# Patient Record
Sex: Male | Born: 1990 | Race: White | Hispanic: No | Marital: Single | Smoking: Current every day smoker
Health system: Southern US, Community
[De-identification: ages and names within clinical notes are randomized; demographics above are authoritative.]

## PROBLEM LIST (undated history)

## (undated) HISTORY — PX: HERNIA REPAIR: SHX51

---
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
## 2013-12-01 ENCOUNTER — Emergency Department: Payer: Self-pay | Admitting: Emergency Medicine

## 2013-12-23 ENCOUNTER — Emergency Department: Payer: Self-pay | Admitting: Emergency Medicine

## 2016-03-11 ENCOUNTER — Encounter: Payer: Self-pay | Admitting: Emergency Medicine

## 2016-03-11 ENCOUNTER — Emergency Department
Admission: EM | Admit: 2016-03-11 | Discharge: 2016-03-11 | Disposition: A | Discharge status: Home / Self Care | Payer: Self-pay | Attending: Emergency Medicine | Admitting: Emergency Medicine

## 2016-03-11 DIAGNOSIS — J40 Bronchitis, not specified as acute or chronic: Secondary | ICD-10-CM | POA: Insufficient documentation

## 2016-03-11 DIAGNOSIS — F17200 Nicotine dependence, unspecified, uncomplicated: Secondary | ICD-10-CM | POA: Insufficient documentation

## 2016-03-11 DIAGNOSIS — J069 Acute upper respiratory infection, unspecified: Secondary | ICD-10-CM | POA: Insufficient documentation

## 2016-03-11 MED ORDER — IPRATROPIUM-ALBUTEROL 0.5-2.5 (3) MG/3ML IN SOLN
RESPIRATORY_TRACT | Status: AC
  Administered 2016-03-11: 3 mL via RESPIRATORY_TRACT
  Filled 2016-03-11: qty 3

## 2016-03-11 MED ORDER — ALBUTEROL SULFATE HFA 108 (90 BASE) MCG/ACT IN AERS: 2.0000 | INHALATION_SPRAY | Freq: Four times a day (QID) | RESPIRATORY_TRACT | 2 refills | Status: AC | PRN

## 2016-03-11 MED ORDER — IPRATROPIUM-ALBUTEROL 0.5-2.5 (3) MG/3ML IN SOLN
3.0000 mL | Freq: Once | RESPIRATORY_TRACT | Status: AC
  Administered 2016-03-11: 3 mL via RESPIRATORY_TRACT

## 2016-03-11 NOTE — ED Notes (Signed)
Reviewed d/c instructions, follow-up care, prescription with pt. Pt verbalized understanding 

## 2016-03-11 NOTE — ED Provider Notes (Signed)
[facility] Emergency Department Provider Note   ____________________________________________   I have reviewed the triage vital signs and the nursing notes.   HISTORY  Chief Complaint Cough and Nasal Congestion   History limited by: Not Limited   HPI Ethan Long is a 25 y.o. male who presents to the emergency department tonight because of concern for cough, congestion, and chest pain. He describes the chest pain as being located in the center chest. It is a tightness across his chest. It is worse when he coughs. States he has had a cough and congestion for the past roughly week and a half. No fevers but has had some chills. He has been trying OTC medication with minimal relief. States he is a smoker.    History reviewed. No pertinent past medical history.  There are no active problems to display for this patient.   Past Surgical History:  Procedure Laterality Date  . HERNIA REPAIR      Prior to Admission medications   Not on File    Allergies Review of patient's allergies indicates no known allergies.  No family history on file.  Social History Social History  Substance Use Topics  . Smoking status: Current Every Day Smoker  . Smokeless tobacco: Current User  . Alcohol use Not on file    Review of Systems  Constitutional: Negative for fever. Cardiovascular: Positive for chest pain. Respiratory: Positive for shortness of breath. Gastrointestinal: Negative for abdominal pain, vomiting and diarrhea. Genitourinary: Negative for dysuria. Musculoskeletal: Negative for back pain. Skin: Negative for rash. Neurological: Negative for headaches, focal weakness or numbness.  10-point ROS otherwise negative.  ____________________________________________   PHYSICAL EXAM:  VITAL SIGNS: ED Triage Vitals  Enc Vitals Group     BP 03/11/16 0257 119/78     Pulse Rate 03/11/16 0257 (!) 101     Resp 03/11/16 0257 18     Temp 03/11/16 0257  98.2 F (36.8 C)     Temp Source 03/11/16 0257 Oral     SpO2 03/11/16 0257 98 %     Weight 03/11/16 0258 125 lb (56.7 kg)     Height 03/11/16 0258 5\' 9"  (1.753 m)     Head Circumference --      Peak Flow --      Pain Score 03/11/16 0258 5   Constitutional: Alert and oriented. Well appearing and in no distress. Eyes: Conjunctivae are normal. Normal extraocular movements. ENT   Head: Normocephalic and atraumatic.   Nose: No congestion/rhinnorhea.   Mouth/Throat: Mucous membranes are moist.   Neck: No stridor. Hematological/Lymphatic/Immunilogical: No cervical lymphadenopathy. Cardiovascular: Normal rate, regular rhythm.  No murmurs, rubs, or gallops.  Respiratory: Normal respiratory effort without tachypnea nor retractions. Diffuse mild expiratory wheezing. Gastrointestinal: Soft and nontender. No distention.  Genitourinary: Deferred Musculoskeletal: Normal range of motion in all extremities. No lower extremity edema. Neurologic:  Normal speech and language. No gross focal neurologic deficits are appreciated.  Skin:  Skin is warm, dry and intact. No rash noted. Psychiatric: Mood and affect are normal. Speech and behavior are normal. Patient exhibits appropriate insight and judgment.  ____________________________________________    LABS (pertinent positives/negatives)  None  ____________________________________________   EKG  None  ____________________________________________    RADIOLOGY  None  ____________________________________________   PROCEDURES  Procedures  ____________________________________________   INITIAL IMPRESSION / ASSESSMENT AND PLAN / ED COURSE  Pertinent labs & imaging results that were available during my care of the patient were reviewed by me and  considered in my medical decision making (see chart for details).  Patient states he feels better after duoneb treatment. Think likely URI with reactive airway disease. Will  discharge with prescription for albuterol. ____________________________________________   FINAL CLINICAL IMPRESSION(S) / ED DIAGNOSES  Final diagnoses:  Upper respiratory tract infection, unspecified type  Bronchitis     Note: This dictation was prepared with Dragon dictation. Any transcriptional errors that result from this process are unintentional     Phineas SemenGraydon Zienna Ahlin, MD 03/11/16 [phone]

## 2016-03-11 NOTE — Discharge Instructions (Signed)
Please seek medical attention for any high fevers, chest pain, shortness of breath, change in behavior, persistent vomiting, bloody stool or any other new or concerning symptoms.  

## 2016-03-11 NOTE — ED Triage Notes (Signed)
Patient with complaint of cough and congestion times week and half.
